# Patient Record
Sex: Male | Born: 1975 | Race: White | Hispanic: No | Marital: Married | Smoking: Never smoker
Health system: Southern US, Community
[De-identification: ages and names within clinical notes are randomized; demographics above are authoritative.]

## PROBLEM LIST (undated history)

## (undated) DIAGNOSIS — J45909 Unspecified asthma, uncomplicated: Secondary | ICD-10-CM

---

## 2010-03-04 ENCOUNTER — Ambulatory Visit: Payer: Self-pay | Admitting: Internal Medicine

## 2015-10-25 ENCOUNTER — Encounter: Payer: Self-pay | Admitting: *Deleted

## 2015-10-25 ENCOUNTER — Ambulatory Visit
Admission: EM | Admit: 2015-10-25 | Discharge: 2015-10-25 | Disposition: A | Discharge status: Home / Self Care | Payer: Managed Care, Other (non HMO) | Attending: Family Medicine | Admitting: Family Medicine

## 2015-10-25 ENCOUNTER — Ambulatory Visit (INDEPENDENT_AMBULATORY_CARE_PROVIDER_SITE_OTHER): Payer: Managed Care, Other (non HMO)

## 2015-10-25 DIAGNOSIS — S82891A Other fracture of right lower leg, initial encounter for closed fracture: Secondary | ICD-10-CM | POA: Diagnosis not present

## 2015-10-25 HISTORY — DX: Unspecified asthma, uncomplicated: J45.909

## 2015-10-25 NOTE — Discharge Instructions (Signed)
Rest. Apply ice and elevate. Keep in splint and use crutches.   Follow up with orthopedic this week. Call tomorrow to schedule.  Follow up with your primary care physician this week as needed. Return to Urgent care for new or worsening concerns.

## 2015-10-25 NOTE — ED Notes (Signed)
Pt states that he was walking yesterday evening stepped in a hole twisting his right ankle, Pt states that he heard a "pop" sound when it happened.  Pt iced over night

## 2015-10-25 NOTE — ED Provider Notes (Signed)
[facility]  ____________________________________________  Time seen: Approximately 1300 PM  I have reviewed the triage vital signs and the nursing notes.   HISTORY  Chief Complaint Ankle Injury   HPI Eric Paul is a 40 y.o. male presents with a complaint of right lateral ankle pain. Patient reports that yesterday evening he was outside working. Patient states that he stepped in a hole where he had removed a tree causing him to roll his right ankle. Patient reports that he has had pain to the right lateral ankle since. Patient reports has continued to a moderate but with pain. Denies pain at rest. Patient states that pain is only with active ambulation. Denies numbness or tingling sensation. Denies pain radiation. Denies fall to the ground. Denies head injury or loss consciousness. Denies neck or back pain. Denies other pain or injury. Patient states current pain is mild.   Past Medical History  Diagnosis Date  . Asthma     There are no active problems to display for this patient. denies  History reviewed. No pertinent past surgical history. Denies  Current Outpatient Rx  Name  Route  Sig  Dispense  Refill  . ibuprofen (ADVIL,MOTRIN) 200 MG tablet   Oral   Take 1,000 mg by mouth every 6 (six) hours as needed.           Allergies Review of patient's allergies indicates no known allergies.  No family history on file.  Social History Social History  Substance Use Topics  . Smoking status: Never Smoker   . Smokeless tobacco: None  . Alcohol Use: Yes    Review of Systems Constitutional: No fever/chills Eyes: No visual changes. ENT: No sore throat. Cardiovascular: Denies chest pain. Respiratory: Denies shortness of breath. Gastrointestinal: No abdominal pain.  No nausea, no vomiting.  No diarrhea.  No constipation. Genitourinary: Negative for dysuria. Musculoskeletal: Negative for back pain.Positve right ankle pain.  Skin: Negative for  rash. Neurological: Negative for headaches, focal weakness or numbness.  10-point ROS otherwise negative.  ____________________________________________   PHYSICAL EXAM:  VITAL SIGNS: ED Triage Vitals  Enc Vitals Group     BP 10/25/15 1200 126/75 mmHg     Pulse Rate 10/25/15 1200 58     Resp -- 18     Temp 10/25/15 1200 97.9 F (36.6 C)     Temp Source 10/25/15 1200 Oral     SpO2 10/25/15 1200 100 %     Weight 10/25/15 1200 168 lb (76.204 kg)     Height 10/25/15 1200  (1.803 m)     Head Cir --      Peak Flow --      Pain Score 10/25/15 1203 5     Pain Loc --      Pain Edu? --      Excl. in GC? --     Constitutional: Alert and oriented. Well appearing and in no acute distress. Eyes: Conjunctivae are normal. PERRL. EOMI. Head: Atraumatic.  Nose: No congestion/rhinnorhea.  Mouth/Throat: Mucous membranes are moist.  . Neck: No stridor.  No cervical spine tenderness to palpation. Cardiovascular: Normal rate, regular rhythm. Grossly normal heart sounds.  Good peripheral circulation. Respiratory: Normal respiratory effort.  No retractions. Lungs CTAB. Gastrointestinal: Soft and nontender. Musculoskeletal: No lower or upper extremity tenderness nor edema. Bilateral pedal pulses equal and easily palpated.  Except: Right lateral distal malleolus tenderness to palpation with moderate swelling, full range of motion present, sensation intact to right lower extremity, no motor or  tendon deficit. Right lower extremity otherwise nontender. Gait not tested due to pain.  Neurologic:  Normal speech and language. No gross focal neurologic deficits are appreciated.  Skin:  Skin is warm, dry and intact. No rash noted. Psychiatric: Mood and affect are normal. Speech and behavior are normal.  ____________________________________________    LABS (all labs ordered are listed, but only abnormal results are displayed)  Labs Reviewed - No data to display  RADIOLOGY  EXAM: RIGHT ANKLE -  COMPLETE 3+ VIEW  COMPARISON: None  FINDINGS: There is an acute fracture involving the tip of the lateral malleolus. The fracture fragments are nondisplaced. Overlying soft tissue swelling is noted.  IMPRESSION: 1. Acute fracture involves the tip of the lateral malleolus.   Electronically Signed By: Signa Kellaylor Stroud M.D. On: 10/25/2015 12:37 I, Renford DillsLindsey Aliannah Holstrom, personally viewed and evaluated these images (plain radiographs) as part of my medical decision making, as well as reviewing the written report by the radiologist.  ____________________________________________   PROCEDURES  Procedure(s) performed:  Right ankle stirrup OCL splint applied by RN. Crutches given. Neurovascular intact post application . ____________________   INITIAL IMPRESSION / ASSESSMENT AND PLAN / ED COURSE  Pertinent labs & imaging results that were available during my care of the patient were reviewed by me and considered in my medical decision making (see chart for details).  Very well-appearing patient. No acute distress. Presents for the complaints of right lateral ankle pain post mechanical injury yesterday at his house while walking outside. Patient reports that he actually stepped in a hole and rolled his right ankle. Right lateral distal malleolus tenderness to palpation with moderate swelling. Right lower extremity otherwise nontender. Bilateral pedal pulses equal. Will evaluate x-ray.  Per radiologist right ankle acute fracture involving the tip of the lateral malleolus. Patient placed and the stirrup splint and crutches. Patient denies need for pain medication. Patient states he'll take over-the-counter ibuprofen as needed. Encourage rest, ice and elevation. Follow-up with orthopedic this week. Orthopedic information given.  Discussed follow up with Primary care physician this week. Discussed follow up and return parameters including no resolution or any worsening concerns. Patient verbalized  understanding and agreed to plan.   ____________________________________________   FINAL CLINICAL IMPRESSION(S) / ED DIAGNOSES  Final diagnoses:  Closed right ankle fracture, initial encounter      Note: This dictation was prepared with Dragon dictation along with smaller phrase technology. Any transcriptional errors that result from this process are unintentional.    Renford DillsLindsey Infiniti Hoefling, NP 11/04/15 2036

## 2016-01-04 ENCOUNTER — Encounter: Payer: Self-pay | Admitting: *Deleted

## 2016-01-04 ENCOUNTER — Ambulatory Visit
Admission: EM | Admit: 2016-01-04 | Discharge: 2016-01-04 | Disposition: A | Discharge status: Home / Self Care | Payer: Managed Care, Other (non HMO) | Attending: Family Medicine | Admitting: Family Medicine

## 2016-01-04 ENCOUNTER — Ambulatory Visit (INDEPENDENT_AMBULATORY_CARE_PROVIDER_SITE_OTHER): Payer: Managed Care, Other (non HMO)

## 2016-01-04 DIAGNOSIS — M79675 Pain in left toe(s): Secondary | ICD-10-CM

## 2016-01-04 DIAGNOSIS — S93509A Unspecified sprain of unspecified toe(s), initial encounter: Secondary | ICD-10-CM

## 2016-01-04 MED ORDER — HYDROCODONE-ACETAMINOPHEN 5-325 MG PO TABS: ORAL_TABLET | ORAL | Status: AC

## 2016-01-04 NOTE — ED Notes (Signed)
Left toe pain and edema. Pt denies injury, states awoke with pain Sunday.

## 2016-01-04 NOTE — ED Provider Notes (Signed)
CSN: [id number]     Arrival date & time 01/04/16  [id number] History   None    Chief Complaint  Patient presents with  . Toe Pain   (Consider location/radiation/quality/duration/timing/severity/associated sxs/prior Treatment) HPI Comments: 40 yo male presents with a c/o left 2nd toe pain since yesterday morning. States he woke up with 2nd left toe pain yesterday and has continued along with "bruise" and slight swelling. Denies any injury, fevers, chills, redness, skin lesions.   Patient is a 40 y.o. male presenting with toe pain. The history is provided by the patient.  Toe Pain    Past Medical History  Diagnosis Date  . Asthma    History reviewed. No pertinent past surgical history. History reviewed. No pertinent family history. Social History  Substance Use Topics  . Smoking status: Never Smoker   . Smokeless tobacco: None  . Alcohol Use: Yes    Review of Systems  Allergies  Review of patient's allergies indicates no known allergies.  Home Medications   Prior to Admission medications   Medication Sig Start Date End Date Taking? Authorizing Provider  ibuprofen (ADVIL,MOTRIN) 200 MG tablet Take 1,000 mg by mouth every 6 (six) hours as needed.   Yes Historical Provider, MD  HYDROcodone-acetaminophen (NORCO/VICODIN) 5-325 MG tablet 1-2 tabs po qd prn 01/04/16   Payton Mccallumrlando Rahil Passey, MD   Meds Ordered and Administered this Visit  Medications - No data to display  BP 111/82 mmHg  Pulse 64  Temp(Src) 97.7 F (36.5 C) (Oral)  Resp 16  Ht 5\' 11"  (1.803 m)  Wt 170 lb (77.111 kg)  BMI 23.72 kg/m2  SpO2 99% No data found.   Physical Exam  Constitutional: He appears well-developed and well-nourished. No distress.  Musculoskeletal:       Left foot: There is bony tenderness (over 2nd MTP joint) and swelling (minimal). There is normal range of motion, no tenderness, normal capillary refill, no crepitus, no deformity and no laceration.       Feet:  Skin: He is not diaphoretic.    Nursing note and vitals reviewed.   ED Course  Procedures (including critical care time)  Labs Review Labs Reviewed - No data to display  Imaging Review Dg Toe 2nd Left  01/04/2016  CLINICAL DATA:  Left second MTP joint pain, swelling. EXAM: LEFT SECOND TOE COMPARISON:  None. FINDINGS: No acute bony abnormality. Specifically, no fracture, subluxation, or dislocation. Soft tissues are intact. Joint spaces are maintained. IMPRESSION: No acute bony abnormality. Electronically Signed   By: Charlett NoseKevin  Dover M.D.   On: 01/04/2016 10:23     Visual Acuity Review  Right Eye Distance:   Left Eye Distance:   Bilateral Distance:    Right Eye Near:   Left Eye Near:    Bilateral Near:         MDM   1. Toe pain, left   2. Sprain of toe, initial encounter    Discharge Medication List as of 01/04/2016 10:35 AM    START taking these medications   Details  HYDROcodone-acetaminophen (NORCO/VICODIN) 5-325 MG tablet 1-2 tabs po qd prn, Print       1. x-ray result (negative) and diagnosis reviewed with patient 2. rx as per orders above; reviewed possible side effects, interactions, risks and benefits  3. Recommend supportive treatment with rest, ice, elevation 4. Follow-up prn if symptoms worsen or don't improve    Payton Mccallumrlando Sabena Winner, MD 01/04/16 1042

## 2017-08-16 IMAGING — CR DG TOE 2ND 2+V*L*
1 series · 1 of 1 positions shown · non-contrast
Comparison: None.

CLINICAL DATA: Left second MTP joint pain, swelling.

EXAM:
LEFT SECOND TOE

[toe lat]
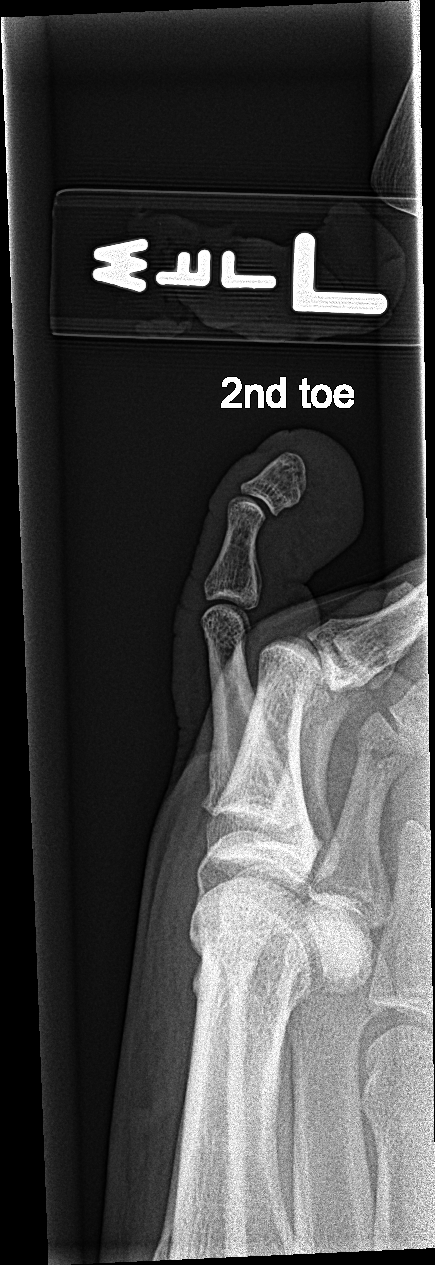

[1 of 1 positions shown; findings below may reference images not displayed]

FINDINGS: No acute bony abnormality. Specifically, no fracture, subluxation,
or dislocation. Soft tissues are intact. Joint spaces are
maintained.
IMPRESSION: No acute bony abnormality.

## 2019-07-05 ENCOUNTER — Ambulatory Visit: Payer: PRIVATE HEALTH INSURANCE | Attending: Internal Medicine

## 2019-07-05 DIAGNOSIS — Z20822 Contact with and (suspected) exposure to covid-19: Secondary | ICD-10-CM

## 2019-07-06 LAB — NOVEL CORONAVIRUS, NAA: SARS-CoV-2, NAA: NOT DETECTED

## 2021-10-27 ENCOUNTER — Other Ambulatory Visit: Payer: Self-pay
